# Patient Record
Sex: Female | Born: 1960 | Race: White | Hispanic: No | Marital: Married | Smoking: Former smoker
Health system: Southern US, Community
[De-identification: ages and names within clinical notes are randomized; demographics above are authoritative.]

## PROBLEM LIST (undated history)

## (undated) DIAGNOSIS — R112 Nausea with vomiting, unspecified: Secondary | ICD-10-CM

## (undated) DIAGNOSIS — I10 Essential (primary) hypertension: Secondary | ICD-10-CM

## (undated) DIAGNOSIS — Z9889 Other specified postprocedural states: Secondary | ICD-10-CM

## (undated) DIAGNOSIS — J189 Pneumonia, unspecified organism: Secondary | ICD-10-CM

## (undated) DIAGNOSIS — Z8489 Family history of other specified conditions: Secondary | ICD-10-CM

## (undated) HISTORY — PX: TUBAL LIGATION: SHX77

## (undated) HISTORY — PX: TOOTH EXTRACTION: SUR596

## (undated) HISTORY — PX: BREAST LUMPECTOMY: SHX2

---

## 2019-11-22 ENCOUNTER — Other Ambulatory Visit: Payer: Self-pay | Admitting: Orthopedic Surgery

## 2020-01-15 ENCOUNTER — Other Ambulatory Visit (HOSPITAL_COMMUNITY): Payer: Self-pay

## 2020-01-15 NOTE — Progress Notes (Signed)
[id number] [place], [place] - [address] AT NEXT TO [facility] [address] [place] [address] Phone: [phone] Fax: [phone]      Your procedure is scheduled on Thursday, January 18, 2020.  Report to [facility] Main Entrance "A" at 5:30 A.M., and check in at the Admitting office.  Call this number if you have problems the morning of surgery:  [phone]  Call [phone] if you have any questions prior to your surgery date Monday-Friday 8am-4pm    Remember:  Do not eat after midnight the night before your surgery  You may drink clear liquids until 4:30AM the morning of your surgery.    Clear liquids allowed are: Water, Non-Citrus Juices (without pulp), Carbonated Beverages, Clear Tea, Black Coffee Only, and Gatorade  Please complete your PRE-SURGERY ENSURE that was provided to you by 4:30AM the morning of surgery.  Please, if able, drink it in one setting. DO NOT SIP.    Take these medicines the morning of surgery with A SIP OF WATER :  Only if needed: Acetaminophen (Tylenol) Cyclobenzaprine (Flexeril) Methocarbamol (Robaxin)  7 days prior to surgery STOP taking any Aspirin (unless otherwise instructed by your surgeon), Aleve, Naproxen, Ibuprofen, Motrin, Advil, Goody's, BC's, all herbal medications, fish oil, and all vitamins.    The Morning of Surgery  Do not wear jewelry, make-up or nail polish.  Do not wear lotions, powders, or perfumes/colognes, or deodorant  Do not shave 48 hours prior to surgery.   Do not bring valuables to the hospital.  [facility] is not responsible for any belongings or valuables.  If you are a smoker, DO NOT Smoke 24 hours prior to surgery  If you wear a CPAP at night please bring your mask the morning of surgery   Remember that you must have someone to transport you home after your surgery, and remain with you for 24 hours if you are discharged the same day.   Please bring  cases for contacts, glasses, hearing aids, dentures or bridgework because it cannot be worn into surgery.    Leave your suitcase in the car.  After surgery it may be brought to your room.  For patients admitted to the hospital, discharge time will be determined by your treatment team.  Patients discharged the day of surgery will not be allowed to drive home.    Special instructions:   [place]- Preparing For Surgery  Before surgery, you can play an important role. Because skin is not sterile, your skin needs to be as free of germs as possible. You can reduce the number of germs on your skin by washing with CHG (chlorahexidine gluconate) Soap before surgery.  CHG is an antiseptic cleaner which kills germs and bonds with the skin to continue killing germs even after washing.    Oral Hygiene is also important to reduce your risk of infection.  Remember - BRUSH YOUR TEETH THE MORNING OF SURGERY WITH YOUR REGULAR TOOTHPASTE  Please do not use if you have an allergy to CHG or antibacterial soaps. If your skin becomes reddened/irritated stop using the CHG.  Do not shave (including legs and underarms) for at least 48 hours prior to first CHG shower. It is OK to shave your face.  Please follow these instructions carefully.   1. Shower the NIGHT BEFORE SURGERY and the MORNING OF SURGERY with CHG Soap.   2. If you chose to wash your hair, wash your hair first  as usual with your normal shampoo.  3. After you shampoo, rinse your hair and body thoroughly to remove the shampoo.  4. Use CHG as you would any other liquid soap. You can apply CHG directly to the skin and wash gently with a scrungie or a clean washcloth.   5. Apply the CHG Soap to your body ONLY FROM THE NECK DOWN.  Do not use on open wounds or open sores. Avoid contact with your eyes, ears, mouth and genitals (private parts). Wash Face and genitals (private parts)  with your normal soap.   6. Wash thoroughly, paying special attention  to the area where your surgery will be performed.  7. Thoroughly rinse your body with warm water from the neck down.  8. DO NOT shower/wash with your normal soap after using and rinsing off the CHG Soap.  9. Pat yourself dry with a CLEAN TOWEL.  10. Wear CLEAN PAJAMAS to bed the night before surgery, wear comfortable clothes the morning of surgery  11. Place CLEAN SHEETS on your bed the night of your first shower and DO NOT SLEEP WITH PETS.    Day of Surgery:  Please shower the morning of surgery with the CHG soap Do not apply any deodorants/lotions. Please wear clean clothes to the hospital/surgery center.   Remember to brush your teeth WITH YOUR REGULAR TOOTHPASTE.   Please read over the following fact sheets that you were given.

## 2020-01-16 ENCOUNTER — Other Ambulatory Visit (HOSPITAL_COMMUNITY)
Admission: RE | Admit: 2020-01-16 | Discharge: 2020-01-16 | Disposition: A | Discharge status: Home / Self Care | Payer: Worker's Compensation | Source: Ambulatory Visit | Attending: Orthopedic Surgery | Admitting: Orthopedic Surgery

## 2020-01-16 ENCOUNTER — Encounter (HOSPITAL_COMMUNITY)
Admission: RE | Admit: 2020-01-16 | Discharge: 2020-01-16 | Disposition: A | Discharge status: Home / Self Care | Payer: Worker's Compensation | Source: Ambulatory Visit | Attending: Orthopedic Surgery | Admitting: Orthopedic Surgery

## 2020-01-16 ENCOUNTER — Other Ambulatory Visit: Payer: Self-pay

## 2020-01-16 ENCOUNTER — Encounter (HOSPITAL_COMMUNITY): Payer: Self-pay

## 2020-01-16 DIAGNOSIS — Z20822 Contact with and (suspected) exposure to covid-19: Secondary | ICD-10-CM | POA: Insufficient documentation

## 2020-01-16 DIAGNOSIS — I10 Essential (primary) hypertension: Secondary | ICD-10-CM | POA: Insufficient documentation

## 2020-01-16 DIAGNOSIS — Z01818 Encounter for other preprocedural examination: Secondary | ICD-10-CM | POA: Insufficient documentation

## 2020-01-16 HISTORY — DX: Pneumonia, unspecified organism: J18.9

## 2020-01-16 HISTORY — DX: Other specified postprocedural states: R11.2

## 2020-01-16 HISTORY — DX: Other specified postprocedural states: Z98.890

## 2020-01-16 HISTORY — DX: Essential (primary) hypertension: I10

## 2020-01-16 HISTORY — DX: Family history of other specified conditions: Z84.89

## 2020-01-16 LAB — CBC WITH DIFFERENTIAL/PLATELET
Abs Immature Granulocytes: 0.01 10*3/uL (ref 0.00–0.07)
Basophils Absolute: 0.1 10*3/uL (ref 0.0–0.1)
Basophils Relative: 1 %
Eosinophils Absolute: 0.2 10*3/uL (ref 0.0–0.5)
Eosinophils Relative: 2 %
HCT: 39 % (ref 36.0–46.0)
Hemoglobin: 12.7 g/dL (ref 12.0–15.0)
Immature Granulocytes: 0 %
Lymphocytes Relative: 24 %
Lymphs Abs: 1.6 10*3/uL (ref 0.7–4.0)
MCH: 33.4 pg (ref 26.0–34.0)
MCHC: 32.6 g/dL (ref 30.0–36.0)
MCV: 102.6 fL — ABNORMAL HIGH (ref 80.0–100.0)
Monocytes Absolute: 0.7 10*3/uL (ref 0.1–1.0)
Monocytes Relative: 10 %
Neutro Abs: 4.1 10*3/uL (ref 1.7–7.7)
Neutrophils Relative %: 63 %
Platelets: 308 10*3/uL (ref 150–400)
RBC: 3.8 MIL/uL — ABNORMAL LOW (ref 3.87–5.11)
RDW: 13.7 % (ref 11.5–15.5)
WBC: 6.6 10*3/uL (ref 4.0–10.5)
nRBC: 0 % (ref 0.0–0.2)

## 2020-01-16 LAB — TYPE AND SCREEN
ABO/RH(D): O POS
Antibody Screen: NEGATIVE

## 2020-01-16 LAB — URINALYSIS, ROUTINE W REFLEX MICROSCOPIC
Bilirubin Urine: NEGATIVE
Glucose, UA: NEGATIVE mg/dL
Hgb urine dipstick: NEGATIVE
Ketones, ur: NEGATIVE mg/dL
Leukocytes,Ua: NEGATIVE
Nitrite: NEGATIVE
Protein, ur: NEGATIVE mg/dL
Specific Gravity, Urine: 1.005 (ref 1.005–1.030)
pH: 6 (ref 5.0–8.0)

## 2020-01-16 LAB — COMPREHENSIVE METABOLIC PANEL
ALT: 11 U/L (ref 0–44)
AST: 22 U/L (ref 15–41)
Albumin: 4 g/dL (ref 3.5–5.0)
Alkaline Phosphatase: 90 U/L (ref 38–126)
Anion gap: 6 (ref 5–15)
BUN: 12 mg/dL (ref 6–20)
CO2: 28 mmol/L (ref 22–32)
Calcium: 9 mg/dL (ref 8.9–10.3)
Chloride: 105 mmol/L (ref 98–111)
Creatinine, Ser: 0.62 mg/dL (ref 0.44–1.00)
GFR calc Af Amer: >60 mL/min (ref >60)
GFR calc non Af Amer: >60 mL/min (ref >60)
Glucose, Bld: 94 mg/dL (ref 70–99)
Potassium: 3.9 mmol/L (ref 3.5–5.1)
Sodium: 139 mmol/L (ref 135–145)
Total Bilirubin: 0.7 mg/dL (ref 0.3–1.2)
Total Protein: 6.4 g/dL — ABNORMAL LOW (ref 6.5–8.1)

## 2020-01-16 LAB — SURGICAL PCR SCREEN
MRSA, PCR: NEGATIVE
Staphylococcus aureus: NEGATIVE

## 2020-01-16 LAB — APTT: aPTT: 34 seconds (ref 24–36)

## 2020-01-16 LAB — PROTIME-INR
INR: 1 (ref 0.8–1.2)
Prothrombin Time: 13.2 seconds (ref 11.4–15.2)

## 2020-01-16 LAB — ABO/RH: ABO/RH(D): O POS

## 2020-01-16 LAB — SARS CORONAVIRUS 2 (TAT 6-24 HRS): SARS Coronavirus 2: NEGATIVE

## 2020-01-16 NOTE — Progress Notes (Signed)
PCP - [facility] Department     EKG - 01/16/20    ERAS Protcol - yes PRE-SURGERY Ensure or G2- ensure given  COVID TEST-  01/16/20   Anesthesia review: no  Patient denies shortness of breath, fever, cough and chest pain at PAT appointment   All instructions explained to the patient, with a verbal understanding of the material. Patient agrees to go over the instructions while at home for a better understanding. Patient also instructed to self quarantine after being tested for COVID-19. The opportunity to ask questions was provided.

## 2020-01-17 NOTE — Anesthesia Preprocedure Evaluation (Addendum)
Anesthesia Evaluation  Patient identified by MRN, date of birth, ID band Patient awake    Reviewed: Allergy & Precautions, NPO status , Patient's Chart, lab work & pertinent test results  History of Anesthesia Complications (+) PONV  Airway Mallampati: II  TM Distance: >3 FB Neck ROM: Full    Dental no notable dental hx. (+) Teeth Intact, Dental Advisory Given,    Pulmonary neg pulmonary ROS, former smoker,    Pulmonary exam normal breath sounds clear to auscultation       Cardiovascular Exercise Tolerance: Good hypertension, Pt. on medications Normal cardiovascular exam Rhythm:Regular Rate:Normal     Neuro/Psych negative neurological ROS  negative psych ROS   GI/Hepatic negative GI ROS, Neg liver ROS,   Endo/Other  negative endocrine ROS  Renal/GU K+ 3.9 Cr 0.62     Musculoskeletal negative musculoskeletal ROS (+)   Abdominal (+) - obese,   Peds  Hematology hgb 12.7 Plt 308   Anesthesia Other Findings   Reproductive/Obstetrics                            Anesthesia Physical Anesthesia Plan  ASA: II  Anesthesia Plan: General   Post-op Pain Management:    Induction: Intravenous  PONV Risk Score and Plan: 4 or greater and Treatment may vary due to age or medical condition, Ondansetron, Dexamethasone and Midazolam  Airway Management Planned: Oral ETT  Additional Equipment: None  Intra-op Plan:   Post-operative Plan: Extubation in OR  Informed Consent: I have reviewed the patients History and Physical, chart, labs and discussed the procedure including the risks, benefits and alternatives for the proposed anesthesia with the patient or authorized representative who has indicated his/her understanding and acceptance.     Dental advisory given  Plan Discussed with: CRNA  Anesthesia Plan Comments:        Anesthesia Quick Evaluation

## 2020-01-18 ENCOUNTER — Encounter (HOSPITAL_COMMUNITY): Payer: Self-pay | Admitting: Orthopedic Surgery

## 2020-01-18 ENCOUNTER — Inpatient Hospital Stay (HOSPITAL_COMMUNITY)
Admission: RE | Admit: 2020-01-18 | Discharge: 2020-01-19 | DRG: 455 | Disposition: A | Discharge status: Home / Self Care | Payer: Worker's Compensation | Attending: Orthopedic Surgery | Admitting: Orthopedic Surgery

## 2020-01-18 ENCOUNTER — Encounter (HOSPITAL_COMMUNITY)
Admission: RE | Disposition: A | Discharge status: Home / Self Care | Payer: Self-pay | Source: Home / Self Care | Attending: Orthopedic Surgery

## 2020-01-18 ENCOUNTER — Inpatient Hospital Stay (HOSPITAL_COMMUNITY): Payer: Worker's Compensation | Admitting: Anesthesiology

## 2020-01-18 ENCOUNTER — Inpatient Hospital Stay (HOSPITAL_COMMUNITY): Payer: Worker's Compensation

## 2020-01-18 ENCOUNTER — Other Ambulatory Visit: Payer: Self-pay

## 2020-01-18 DIAGNOSIS — Z8701 Personal history of pneumonia (recurrent): Secondary | ICD-10-CM | POA: Diagnosis not present

## 2020-01-18 DIAGNOSIS — I10 Essential (primary) hypertension: Secondary | ICD-10-CM | POA: Diagnosis present

## 2020-01-18 DIAGNOSIS — Z6827 Body mass index (BMI) 27.0-27.9, adult: Secondary | ICD-10-CM

## 2020-01-18 DIAGNOSIS — M4800 Spinal stenosis, site unspecified: Secondary | ICD-10-CM | POA: Diagnosis present

## 2020-01-18 DIAGNOSIS — M4316 Spondylolisthesis, lumbar region: Secondary | ICD-10-CM | POA: Diagnosis present

## 2020-01-18 DIAGNOSIS — Z87891 Personal history of nicotine dependence: Secondary | ICD-10-CM

## 2020-01-18 DIAGNOSIS — E669 Obesity, unspecified: Secondary | ICD-10-CM | POA: Diagnosis present

## 2020-01-18 DIAGNOSIS — Z20822 Contact with and (suspected) exposure to covid-19: Secondary | ICD-10-CM | POA: Diagnosis present

## 2020-01-18 DIAGNOSIS — M5410 Radiculopathy, site unspecified: Secondary | ICD-10-CM

## 2020-01-18 DIAGNOSIS — M5416 Radiculopathy, lumbar region: Secondary | ICD-10-CM | POA: Diagnosis present

## 2020-01-18 DIAGNOSIS — Z79899 Other long term (current) drug therapy: Secondary | ICD-10-CM

## 2020-01-18 DIAGNOSIS — Z880 Allergy status to penicillin: Secondary | ICD-10-CM | POA: Diagnosis not present

## 2020-01-18 DIAGNOSIS — Z419 Encounter for procedure for purposes other than remedying health state, unspecified: Secondary | ICD-10-CM

## 2020-01-18 DIAGNOSIS — Z9109 Other allergy status, other than to drugs and biological substances: Secondary | ICD-10-CM

## 2020-01-18 DIAGNOSIS — M48062 Spinal stenosis, lumbar region with neurogenic claudication: Secondary | ICD-10-CM | POA: Diagnosis present

## 2020-01-18 HISTORY — PX: TRANSFORAMINAL LUMBAR INTERBODY FUSION (TLIF) WITH PEDICLE SCREW FIXATION 2 LEVEL: SHX6142

## 2020-01-18 SURGERY — TRANSFORAMINAL LUMBAR INTERBODY FUSION (TLIF) WITH PEDICLE SCREW FIXATION 2 LEVEL
Anesthesia: General | Site: Back | Laterality: Left

## 2020-01-18 MED ORDER — ZOLPIDEM TARTRATE 5 MG PO TABS: 5.0000 mg | ORAL_TABLET | Freq: Every evening | ORAL | Status: DC | PRN

## 2020-01-18 MED ORDER — LIDOCAINE 2% (20 MG/ML) 5 ML SYRINGE
INTRAMUSCULAR | Status: AC
  Filled 2020-01-18: qty 5

## 2020-01-18 MED ORDER — ACETAMINOPHEN 10 MG/ML IV SOLN: 1000.0000 mg | Freq: Once | INTRAVENOUS | Status: DC | PRN

## 2020-01-18 MED ORDER — MIDAZOLAM HCL 5 MG/5ML IJ SOLN
INTRAMUSCULAR | Status: DC | PRN
  Administered 2020-01-18: 2 mg via INTRAVENOUS

## 2020-01-18 MED ORDER — PROPOFOL 10 MG/ML IV BOLUS
INTRAVENOUS | Status: AC
  Filled 2020-01-18: qty 20

## 2020-01-18 MED ORDER — 0.9 % SODIUM CHLORIDE (POUR BTL) OPTIME
TOPICAL | Status: DC | PRN
  Administered 2020-01-18: 09:00:00 3000 mL

## 2020-01-18 MED ORDER — SCOPOLAMINE 1 MG/3DAYS TD PT72
1.0000 | MEDICATED_PATCH | Freq: Once | TRANSDERMAL | Status: DC
  Administered 2020-01-18: 1.5 mg via TRANSDERMAL
  Filled 2020-01-18: qty 1

## 2020-01-18 MED ORDER — PHENYLEPHRINE 40 MCG/ML (10ML) SYRINGE FOR IV PUSH (FOR BLOOD PRESSURE SUPPORT)
PREFILLED_SYRINGE | INTRAVENOUS | Status: AC
  Filled 2020-01-18: qty 20

## 2020-01-18 MED ORDER — DEXAMETHASONE SODIUM PHOSPHATE 10 MG/ML IJ SOLN
INTRAMUSCULAR | Status: DC | PRN
  Administered 2020-01-18: 10 mg via INTRAVENOUS

## 2020-01-18 MED ORDER — PHENYLEPHRINE 40 MCG/ML (10ML) SYRINGE FOR IV PUSH (FOR BLOOD PRESSURE SUPPORT)
PREFILLED_SYRINGE | INTRAVENOUS | Status: DC | PRN
  Administered 2020-01-18: 80 ug via INTRAVENOUS

## 2020-01-18 MED ORDER — THROMBIN 20000 UNITS EX KIT
PACK | CUTANEOUS | Status: DC | PRN
  Administered 2020-01-18: 20 mL via TOPICAL

## 2020-01-18 MED ORDER — FENTANYL CITRATE (PF) 250 MCG/5ML IJ SOLN
INTRAMUSCULAR | Status: DC | PRN
  Administered 2020-01-18: 150 ug via INTRAVENOUS
  Administered 2020-01-18 (×2): 100 ug via INTRAVENOUS
  Administered 2020-01-18 (×3): 50 ug via INTRAVENOUS
  Administered 2020-01-18: 100 ug via INTRAVENOUS
  Administered 2020-01-18: 50 ug via INTRAVENOUS

## 2020-01-18 MED ORDER — FLEET ENEMA 7-19 GM/118ML RE ENEM: 1.0000 | ENEMA | Freq: Once | RECTAL | Status: DC | PRN

## 2020-01-18 MED ORDER — MENTHOL 3 MG MT LOZG: 1.0000 | LOZENGE | OROMUCOSAL | Status: DC | PRN

## 2020-01-18 MED ORDER — BISACODYL 5 MG PO TBEC: 5.0000 mg | DELAYED_RELEASE_TABLET | Freq: Every day | ORAL | Status: DC | PRN

## 2020-01-18 MED ORDER — HYDROCODONE-ACETAMINOPHEN 7.5-325 MG PO TABS: 1.0000 | ORAL_TABLET | Freq: Once | ORAL | Status: DC | PRN

## 2020-01-18 MED ORDER — METHYLENE BLUE 0.5 % INJ SOLN
INTRAVENOUS | Status: DC | PRN
  Administered 2020-01-18: .2 mL via INTRADERMAL

## 2020-01-18 MED ORDER — SUCCINYLCHOLINE CHLORIDE 200 MG/10ML IV SOSY
PREFILLED_SYRINGE | INTRAVENOUS | Status: DC | PRN
  Administered 2020-01-18: 100 mg via INTRAVENOUS

## 2020-01-18 MED ORDER — ROCURONIUM BROMIDE 10 MG/ML (PF) SYRINGE
PREFILLED_SYRINGE | INTRAVENOUS | Status: AC
  Filled 2020-01-18: qty 10

## 2020-01-18 MED ORDER — ALUM & MAG HYDROXIDE-SIMETH 200-200-20 MG/5ML PO SUSP: 30.0000 mL | Freq: Four times a day (QID) | ORAL | Status: DC | PRN

## 2020-01-18 MED ORDER — BUPIVACAINE LIPOSOME 1.3 % IJ SUSP
20.0000 mL | Freq: Once | INTRAMUSCULAR | Status: DC
  Filled 2020-01-18: qty 20

## 2020-01-18 MED ORDER — THROMBIN (RECOMBINANT) 20000 UNITS EX SOLR
CUTANEOUS | Status: AC
  Filled 2020-01-18: qty 20000

## 2020-01-18 MED ORDER — MEPERIDINE HCL 25 MG/ML IJ SOLN: 6.2500 mg | INTRAMUSCULAR | Status: DC | PRN

## 2020-01-18 MED ORDER — PROPOFOL 10 MG/ML IV BOLUS
INTRAVENOUS | Status: DC | PRN
  Administered 2020-01-18: 150 mg via INTRAVENOUS

## 2020-01-18 MED ORDER — CYCLOBENZAPRINE HCL 10 MG PO TABS
10.0000 mg | ORAL_TABLET | Freq: Three times a day (TID) | ORAL | Status: DC | PRN
  Administered 2020-01-18 – 2020-01-19 (×3): 10 mg via ORAL
  Filled 2020-01-18 (×3): qty 1

## 2020-01-18 MED ORDER — SENNOSIDES-DOCUSATE SODIUM 8.6-50 MG PO TABS: 1.0000 | ORAL_TABLET | Freq: Every evening | ORAL | Status: DC | PRN

## 2020-01-18 MED ORDER — POTASSIUM CHLORIDE IN NACL 20-0.9 MEQ/L-% IV SOLN: INTRAVENOUS | Status: DC

## 2020-01-18 MED ORDER — SODIUM CHLORIDE 0.9 % IV SOLN: INTRAVENOUS | Status: DC | PRN

## 2020-01-18 MED ORDER — LIDOCAINE 2% (20 MG/ML) 5 ML SYRINGE
INTRAMUSCULAR | Status: DC | PRN
  Administered 2020-01-18: 100 mg via INTRAVENOUS

## 2020-01-18 MED ORDER — SODIUM CHLORIDE 0.9 % IV SOLN: 250.0000 mL | INTRAVENOUS | Status: DC

## 2020-01-18 MED ORDER — OXYCODONE-ACETAMINOPHEN 5-325 MG PO TABS
1.0000 | ORAL_TABLET | ORAL | Status: DC | PRN
  Administered 2020-01-18 – 2020-01-19 (×6): 2 via ORAL
  Filled 2020-01-18 (×6): qty 2

## 2020-01-18 MED ORDER — LOSARTAN POTASSIUM 50 MG PO TABS
50.0000 mg | ORAL_TABLET | Freq: Two times a day (BID) | ORAL | Status: DC
  Administered 2020-01-18 – 2020-01-19 (×2): 50 mg via ORAL
  Filled 2020-01-18 (×2): qty 1

## 2020-01-18 MED ORDER — MIDAZOLAM HCL 2 MG/2ML IJ SOLN
INTRAMUSCULAR | Status: AC
  Filled 2020-01-18: qty 2

## 2020-01-18 MED ORDER — LABETALOL HCL 5 MG/ML IV SOLN
INTRAVENOUS | Status: AC
  Filled 2020-01-18: qty 4

## 2020-01-18 MED ORDER — ALBUMIN HUMAN 5 % IV SOLN: INTRAVENOUS | Status: DC | PRN

## 2020-01-18 MED ORDER — LABETALOL HCL 5 MG/ML IV SOLN
5.0000 mg | Freq: Once | INTRAVENOUS | Status: AC
  Administered 2020-01-18: 5 mg via INTRAVENOUS

## 2020-01-18 MED ORDER — METHYLENE BLUE 0.5 % INJ SOLN
INTRAVENOUS | Status: AC
  Filled 2020-01-18: qty 10

## 2020-01-18 MED ORDER — LACTATED RINGERS IV SOLN: INTRAVENOUS | Status: DC | PRN

## 2020-01-18 MED ORDER — BUPIVACAINE-EPINEPHRINE 0.25% -1:200000 IJ SOLN
INTRAMUSCULAR | Status: DC | PRN
  Administered 2020-01-18: 30 mL

## 2020-01-18 MED ORDER — EPINEPHRINE PF 1 MG/ML IJ SOLN
INTRAMUSCULAR | Status: AC
  Filled 2020-01-18: qty 1

## 2020-01-18 MED ORDER — ROCURONIUM BROMIDE 10 MG/ML (PF) SYRINGE
PREFILLED_SYRINGE | INTRAVENOUS | Status: DC | PRN
  Administered 2020-01-18: 20 mg via INTRAVENOUS
  Administered 2020-01-18: 40 mg via INTRAVENOUS
  Administered 2020-01-18: 10 mg via INTRAVENOUS
  Administered 2020-01-18 (×2): 20 mg via INTRAVENOUS

## 2020-01-18 MED ORDER — AMLODIPINE BESYLATE 5 MG PO TABS
5.0000 mg | ORAL_TABLET | Freq: Every day | ORAL | Status: DC
  Administered 2020-01-18: 5 mg via ORAL
  Filled 2020-01-18: qty 1

## 2020-01-18 MED ORDER — VANCOMYCIN HCL IN DEXTROSE 1-5 GM/200ML-% IV SOLN
1000.0000 mg | Freq: Two times a day (BID) | INTRAVENOUS | Status: DC
  Administered 2020-01-18 – 2020-01-19 (×2): 1000 mg via INTRAVENOUS
  Filled 2020-01-18 (×2): qty 200

## 2020-01-18 MED ORDER — POVIDONE-IODINE 7.5 % EX SOLN
Freq: Once | CUTANEOUS | Status: DC
  Filled 2020-01-18: qty 118

## 2020-01-18 MED ORDER — ONDANSETRON HCL 4 MG PO TABS: 4.0000 mg | ORAL_TABLET | Freq: Four times a day (QID) | ORAL | Status: DC | PRN

## 2020-01-18 MED ORDER — VANCOMYCIN HCL IN DEXTROSE 1-5 GM/200ML-% IV SOLN
1000.0000 mg | Freq: Once | INTRAVENOUS | Status: AC
  Administered 2020-01-18: 1000 mg via INTRAVENOUS

## 2020-01-18 MED ORDER — PHENOL 1.4 % MT LIQD: 1.0000 | OROMUCOSAL | Status: DC | PRN

## 2020-01-18 MED ORDER — MORPHINE SULFATE (PF) 2 MG/ML IV SOLN: 1.0000 mg | INTRAVENOUS | Status: DC | PRN

## 2020-01-18 MED ORDER — ACETAMINOPHEN 650 MG RE SUPP: 650.0000 mg | RECTAL | Status: DC | PRN

## 2020-01-18 MED ORDER — PHENYLEPHRINE HCL-NACL 10-0.9 MG/250ML-% IV SOLN
INTRAVENOUS | Status: DC | PRN
  Administered 2020-01-18: 30 ug/min via INTRAVENOUS

## 2020-01-18 MED ORDER — ACETAMINOPHEN 325 MG PO TABS: 650.0000 mg | ORAL_TABLET | ORAL | Status: DC | PRN

## 2020-01-18 MED ORDER — KETAMINE HCL 10 MG/ML IJ SOLN
INTRAMUSCULAR | Status: DC | PRN
  Administered 2020-01-18: 50 mg via INTRAVENOUS

## 2020-01-18 MED ORDER — ONDANSETRON HCL 4 MG/2ML IJ SOLN
INTRAMUSCULAR | Status: DC | PRN
  Administered 2020-01-18: 4 mg via INTRAVENOUS

## 2020-01-18 MED ORDER — ARTIFICIAL TEARS OPHTHALMIC OINT
TOPICAL_OINTMENT | OPHTHALMIC | Status: DC | PRN
  Administered 2020-01-18: 1 via OPHTHALMIC

## 2020-01-18 MED ORDER — FENTANYL CITRATE (PF) 250 MCG/5ML IJ SOLN
INTRAMUSCULAR | Status: AC
  Filled 2020-01-18: qty 5

## 2020-01-18 MED ORDER — PROPOFOL 500 MG/50ML IV EMUL
INTRAVENOUS | Status: DC | PRN
  Administered 2020-01-18: 150 ug/kg/min via INTRAVENOUS
  Administered 2020-01-18: 120 ug/kg/min via INTRAVENOUS
  Administered 2020-01-18: 150 ug/kg/min via INTRAVENOUS
  Administered 2020-01-18: 120 ug/kg/min via INTRAVENOUS

## 2020-01-18 MED ORDER — KETAMINE HCL 50 MG/5ML IJ SOSY
PREFILLED_SYRINGE | INTRAMUSCULAR | Status: AC
  Filled 2020-01-18: qty 5

## 2020-01-18 MED ORDER — HYDROMORPHONE HCL 1 MG/ML IJ SOLN
INTRAMUSCULAR | Status: AC
  Filled 2020-01-18: qty 1

## 2020-01-18 MED ORDER — PROMETHAZINE HCL 25 MG/ML IJ SOLN: 6.2500 mg | INTRAMUSCULAR | Status: DC | PRN

## 2020-01-18 MED ORDER — SODIUM CHLORIDE 0.9% FLUSH
3.0000 mL | Freq: Two times a day (BID) | INTRAVENOUS | Status: DC
  Administered 2020-01-18 – 2020-01-19 (×2): 3 mL via INTRAVENOUS

## 2020-01-18 MED ORDER — SUCCINYLCHOLINE CHLORIDE 200 MG/10ML IV SOSY
PREFILLED_SYRINGE | INTRAVENOUS | Status: AC
  Filled 2020-01-18: qty 10

## 2020-01-18 MED ORDER — OXYBUTYNIN CHLORIDE ER 5 MG PO TB24
5.0000 mg | ORAL_TABLET | Freq: Every day | ORAL | Status: DC
  Administered 2020-01-18: 22:00:00 5 mg via ORAL
  Filled 2020-01-18 (×2): qty 1

## 2020-01-18 MED ORDER — ONDANSETRON HCL 4 MG/2ML IJ SOLN: 4.0000 mg | Freq: Four times a day (QID) | INTRAMUSCULAR | Status: DC | PRN

## 2020-01-18 MED ORDER — PROPOFOL 500 MG/50ML IV EMUL: INTRAVENOUS | Status: DC | PRN

## 2020-01-18 MED ORDER — BUPIVACAINE HCL (PF) 0.25 % IJ SOLN
INTRAMUSCULAR | Status: AC
  Filled 2020-01-18: qty 30

## 2020-01-18 MED ORDER — VANCOMYCIN HCL IN DEXTROSE 1-5 GM/200ML-% IV SOLN
INTRAVENOUS | Status: AC
  Filled 2020-01-18: qty 200

## 2020-01-18 MED ORDER — DEXAMETHASONE SODIUM PHOSPHATE 10 MG/ML IJ SOLN
INTRAMUSCULAR | Status: AC
  Filled 2020-01-18: qty 1

## 2020-01-18 MED ORDER — ARTIFICIAL TEARS OPHTHALMIC OINT
TOPICAL_OINTMENT | OPHTHALMIC | Status: AC
  Filled 2020-01-18: qty 3.5

## 2020-01-18 MED ORDER — SUGAMMADEX SODIUM 200 MG/2ML IV SOLN
INTRAVENOUS | Status: DC | PRN
  Administered 2020-01-18: 200 mg via INTRAVENOUS

## 2020-01-18 MED ORDER — DOCUSATE SODIUM 100 MG PO CAPS
100.0000 mg | ORAL_CAPSULE | Freq: Two times a day (BID) | ORAL | Status: DC
  Administered 2020-01-18 – 2020-01-19 (×3): 100 mg via ORAL
  Filled 2020-01-18 (×3): qty 1

## 2020-01-18 MED ORDER — PROPOFOL 1000 MG/100ML IV EMUL
INTRAVENOUS | Status: AC
  Filled 2020-01-18: qty 300

## 2020-01-18 MED ORDER — BUPIVACAINE LIPOSOME 1.3 % IJ SUSP
INTRAMUSCULAR | Status: DC | PRN
  Administered 2020-01-18: 20 mL

## 2020-01-18 MED ORDER — SODIUM CHLORIDE 0.9% FLUSH: 3.0000 mL | INTRAVENOUS | Status: DC | PRN

## 2020-01-18 MED ORDER — HYDROMORPHONE HCL 1 MG/ML IJ SOLN
0.2500 mg | INTRAMUSCULAR | Status: DC | PRN
  Administered 2020-01-18 (×3): 0.5 mg via INTRAVENOUS

## 2020-01-18 MED ORDER — PROPOFOL 1000 MG/100ML IV EMUL
INTRAVENOUS | Status: AC
  Filled 2020-01-18: qty 100

## 2020-01-18 SURGICAL SUPPLY — 96 items
BENZOIN TINCTURE PRP APPL 2/3 (GAUZE/BANDAGES/DRESSINGS) ×3 IMPLANT
BLADE CLIPPER SURG (BLADE) IMPLANT
BONE VIVIGEN FORMABLE 10CC (Bone Implant) ×3 IMPLANT
BUR PRESCISION 1.7 ELITE (BURR) ×3 IMPLANT
BUR ROUND FLUTED 5 RND (BURR) ×2 IMPLANT
BUR ROUND FLUTED 5MM RND (BURR) ×1
BUR ROUND PRECISION 4.0 (BURR) IMPLANT
BUR ROUND PRECISION 4.0MM (BURR)
BUR SABER RD CUTTING 3.0 (BURR) IMPLANT
BUR SABER RD CUTTING 3.0MM (BURR)
CAGE BULLET CONCORDE 9X10X27 (Cage) ×2 IMPLANT
CAGE BULLET CONCORDE 9X10X27MM (Cage) ×1 IMPLANT
CAGE CONCORDE BULLET 11X12X27 (Cage) ×2 IMPLANT
CAGE SPNL PRLL BLT NOSE 27X11 (Cage) ×1 IMPLANT
CARTRIDGE OIL MAESTRO DRILL (MISCELLANEOUS) ×1 IMPLANT
CLOSURE STERI-STRIP 1/2X4 (GAUZE/BANDAGES/DRESSINGS) ×1
CLOSURE WOUND 1/2 X4 (GAUZE/BANDAGES/DRESSINGS) ×2
CLSR STERI-STRIP ANTIMIC 1/2X4 (GAUZE/BANDAGES/DRESSINGS) ×2 IMPLANT
CONT SPEC 4OZ CLIKSEAL STRL BL (MISCELLANEOUS) ×3 IMPLANT
COVER MAYO STAND STRL (DRAPES) ×6 IMPLANT
COVER SURGICAL LIGHT HANDLE (MISCELLANEOUS) ×3 IMPLANT
COVER WAND RF STERILE (DRAPES) ×3 IMPLANT
DIFFUSER DRILL AIR PNEUMATIC (MISCELLANEOUS) ×3 IMPLANT
DRAIN CHANNEL 15F RND FF W/TCR (WOUND CARE) IMPLANT
DRAPE C-ARM 42X72 X-RAY (DRAPES) ×3 IMPLANT
DRAPE C-ARMOR (DRAPES) IMPLANT
DRAPE POUCH INSTRU U-SHP 10X18 (DRAPES) ×3 IMPLANT
DRAPE SURG 17X23 STRL (DRAPES) ×12 IMPLANT
DURAPREP 26ML APPLICATOR (WOUND CARE) ×3 IMPLANT
ELECT BLADE 4.0 EZ CLEAN MEGAD (MISCELLANEOUS) ×3
ELECT CAUTERY BLADE 6.4 (BLADE) ×3 IMPLANT
ELECT REM PT RETURN 9FT ADLT (ELECTROSURGICAL) ×3
ELECTRODE BLDE 4.0 EZ CLN MEGD (MISCELLANEOUS) ×1 IMPLANT
ELECTRODE REM PT RTRN 9FT ADLT (ELECTROSURGICAL) ×1 IMPLANT
EVACUATOR SILICONE 100CC (DRAIN) IMPLANT
FEE INTRAOP MONITOR IMPULS NCS (MISCELLANEOUS) ×1 IMPLANT
FILTER STRAW FLUID ASPIR (MISCELLANEOUS) ×3 IMPLANT
GAUZE 4X4 16PLY RFD (DISPOSABLE) ×3 IMPLANT
GAUZE SPONGE 4X4 12PLY STRL (GAUZE/BANDAGES/DRESSINGS) ×3 IMPLANT
GLOVE BIO SURGEON STRL SZ7 (GLOVE) ×3 IMPLANT
GLOVE BIO SURGEON STRL SZ8 (GLOVE) ×3 IMPLANT
GLOVE BIOGEL PI IND STRL 7.0 (GLOVE) ×2 IMPLANT
GLOVE BIOGEL PI IND STRL 8 (GLOVE) ×1 IMPLANT
GLOVE BIOGEL PI INDICATOR 7.0 (GLOVE) ×4
GLOVE BIOGEL PI INDICATOR 8 (GLOVE) ×2
GLOVE SURG SS PI 7.0 STRL IVOR (GLOVE) ×3 IMPLANT
GOWN STRL REUS W/ TWL LRG LVL3 (GOWN DISPOSABLE) ×2 IMPLANT
GOWN STRL REUS W/ TWL XL LVL3 (GOWN DISPOSABLE) ×1 IMPLANT
GOWN STRL REUS W/TWL LRG LVL3 (GOWN DISPOSABLE) ×4
GOWN STRL REUS W/TWL XL LVL3 (GOWN DISPOSABLE) ×2
INTRAOP MONITOR FEE IMPULS NCS (MISCELLANEOUS) ×1
INTRAOP MONITOR FEE IMPULSE (MISCELLANEOUS) ×2
IV CATH 14GX2 1/4 (CATHETERS) ×3 IMPLANT
KIT BASIN OR (CUSTOM PROCEDURE TRAY) ×3 IMPLANT
KIT POSITION SURG JACKSON T1 (MISCELLANEOUS) ×3 IMPLANT
KIT TURNOVER KIT B (KITS) ×3 IMPLANT
MARKER SKIN DUAL TIP RULER LAB (MISCELLANEOUS) ×6 IMPLANT
NEEDLE 18GX1X1/2 (RX/OR ONLY) (NEEDLE) ×3 IMPLANT
NEEDLE 22X1 1/2 (OR ONLY) (NEEDLE) ×6 IMPLANT
NEEDLE HYPO 25GX1X1/2 BEV (NEEDLE) ×3 IMPLANT
NEEDLE SPNL 18GX3.5 QUINCKE PK (NEEDLE) ×6 IMPLANT
NS IRRIG 1000ML POUR BTL (IV SOLUTION) ×3 IMPLANT
OIL CARTRIDGE MAESTRO DRILL (MISCELLANEOUS) ×3
PACK LAMINECTOMY ORTHO (CUSTOM PROCEDURE TRAY) ×3 IMPLANT
PACK UNIVERSAL I (CUSTOM PROCEDURE TRAY) ×3 IMPLANT
PAD ARMBOARD 7.5X6 YLW CONV (MISCELLANEOUS) ×6 IMPLANT
PATTIES SURGICAL .5 X1 (DISPOSABLE) ×3 IMPLANT
PATTIES SURGICAL .5X1.5 (GAUZE/BANDAGES/DRESSINGS) ×3 IMPLANT
PROBE PED 2.3 SCRW/BT NCS (MISCELLANEOUS) ×1 IMPLANT
PROBE PEDCLE 2.3 SCRW/BALL TIP (MISCELLANEOUS) ×2
ROD EXPEDIUM PER BENT 65MM (Rod) ×6 IMPLANT
SCREW SET SINGLE INNER (Screw) ×18 IMPLANT
SCREW VIPER CORT FIX 6.00X30 (Screw) ×12 IMPLANT
SCREW VIPER CORT FIX 6X35 (Screw) ×6 IMPLANT
SPONGE INTESTINAL PEANUT (DISPOSABLE) ×3 IMPLANT
SPONGE SURGIFOAM ABS GEL 100 (HEMOSTASIS) ×3 IMPLANT
STRIP CLOSURE SKIN 1/2X4 (GAUZE/BANDAGES/DRESSINGS) ×4 IMPLANT
SURGIFLO W/THROMBIN 8M KIT (HEMOSTASIS) IMPLANT
SUT BONE WAX W31G (SUTURE) ×3 IMPLANT
SUT MNCRL AB 4-0 PS2 18 (SUTURE) ×3 IMPLANT
SUT VIC AB 0 CT1 18XCR BRD 8 (SUTURE) ×1 IMPLANT
SUT VIC AB 0 CT1 8-18 (SUTURE) ×2
SUT VIC AB 1 CT1 18XCR BRD 8 (SUTURE) ×1 IMPLANT
SUT VIC AB 1 CT1 8-18 (SUTURE) ×2
SUT VIC AB 2-0 CT2 18 VCP726D (SUTURE) ×3 IMPLANT
SYR 20ML LL LF (SYRINGE) ×6 IMPLANT
SYR BULB IRRIGATION 50ML (SYRINGE) ×3 IMPLANT
SYR CONTROL 10ML LL (SYRINGE) ×6 IMPLANT
SYR TB 1ML LUER SLIP (SYRINGE) ×3 IMPLANT
TAP EXPEDIUM DL 4.35 (INSTRUMENTS) ×3 IMPLANT
TAP EXPEDIUM DL 5.0 (INSTRUMENTS) ×3 IMPLANT
TAP EXPEDIUM DL 6.0 (INSTRUMENTS) ×3 IMPLANT
TAPE CLOTH SURG 6X10 WHT LF (GAUZE/BANDAGES/DRESSINGS) ×3 IMPLANT
TRAY FOLEY MTR SLVR 16FR STAT (SET/KITS/TRAYS/PACK) ×3 IMPLANT
WATER STERILE IRR 1000ML POUR (IV SOLUTION) ×3 IMPLANT
YANKAUER SUCT BULB TIP NO VENT (SUCTIONS) ×3 IMPLANT

## 2020-01-18 NOTE — Progress Notes (Signed)
Spoke with Dr Richardson Landry re: SBP/DBP elevation with preop being 136/92, orders x 1 for labetalol IV

## 2020-01-18 NOTE — H&P (Signed)
PREOPERATIVE H&P  Chief Complaint: Bilateral leg pain  HPI: Tracy Vincent is a 59 y.o. female who presents with ongoing pain in the bilateral legs x 3 years  MRI reveals severe stenosis and instability spanning L2-L5  Patient has failed multiple forms of conservative care and continues to have pain (see office notes for additional details regarding the patient's full course of treatment)  Past Medical History:  Diagnosis Date  . Family history of adverse reaction to anesthesia   . Hypertension   . Pneumonia    hx of pneumonia per pt   . PONV (postoperative nausea and vomiting)    Past Surgical History:  Procedure Laterality Date  . BREAST LUMPECTOMY Left    per pt in 2001  . TOOTH EXTRACTION Left    per pt on 01/09/20 (pt was given lidocaine for procedure)  . TUBAL LIGATION     per pt 1998   Social History   Socioeconomic History  . Marital status: Married    Spouse name: Not on file  . Number of children: Not on file  . Years of education: Not on file  . Highest education level: Not on file  Occupational History  . Not on file  Tobacco Use  . Smoking status: Former Smoker    Packs/day: 0.00    Quit date: 1998    Years since quitting: 23.0  . Smokeless tobacco: Never Used  Substance and Sexual Activity  . Alcohol use: Yes    Alcohol/week: 3.0 standard drinks    Types: 3 Glasses of wine per week  . Drug use: Never  . Sexual activity: Not on file  Other Topics Concern  . Not on file  Social History Narrative  . Not on file   Social Determinants of Health   Financial Resource Strain:   . Difficulty of Paying Living Expenses: Not on file  Food Insecurity:   . Worried About Charity fundraiser in the Last Year: Not on file  . Ran Out of Food in the Last Year: Not on file  Transportation Needs:   . Lack of Transportation (Medical): Not on file  . Lack of Transportation (Non-Medical): Not on file  Physical Activity:   . Days of Exercise per Week: Not on  file  . Minutes of Exercise per Session: Not on file  Stress:   . Feeling of Stress : Not on file  Social Connections:   . Frequency of Communication with Friends and Family: Not on file  . Frequency of Social Gatherings with Friends and Family: Not on file  . Attends Religious Services: Not on file  . Active Member of Clubs or Organizations: Not on file  . Attends Archivist Meetings: Not on file  . Marital Status: Not on file   History reviewed. No pertinent family history. Allergies  Allergen Reactions  . Other     Other reaction(s): Flushing (ALLERGY/intolerance) Brain 360 Tablets--Caused "Hot/Flush Feeling all over body."  . Penicillins Hives and Swelling    SWELLING OF THE TONGUE-- PEN G Did it involve swelling of the face/tongue/throat, SOB, or low BP? Yes Did it involve sudden or severe rash/hives, skin peeling, or any reaction on the inside of your mouth or nose? No Did you need to seek medical attention at a hospital or doctor's office? No     Prior to Admission medications   Medication Sig Start Date End Date Taking? Authorizing Provider  acetaminophen (TYLENOL) 500 MG tablet Take 1,000  mg by mouth every 6 (six) hours as needed for moderate pain.   Yes [provider]  amLODipine (NORVASC) 5 MG tablet Take 5 mg by mouth at bedtime.   Yes [provider]  Biotin 96789 MCG TABS Take 10,000 mcg by mouth daily.   Yes [provider]  Black Cohosh 540 MG CAPS Take 540 mg by mouth daily.   Yes [provider]  cyclobenzaprine (FLEXERIL) 10 MG tablet Take 10 mg by mouth 3 (three) times daily as needed for muscle spasms.   Yes [provider]  diclofenac (VOLTAREN) 75 MG EC tablet Take 75 mg by mouth 2 (two) times daily.   Yes [provider]  Iodine, Kelp, (KELP PO) Take 225 mcg by mouth daily.   Yes [provider]  losartan (COZAAR) 100 MG tablet Take 50 mg by mouth 2 (two) times daily.   Yes [provider]  methocarbamol (ROBAXIN) 500 MG tablet Take 500 mg by mouth every 8 (eight) hours as needed for muscle spasms.   Yes [provider]  Omega-3 Fatty Acids (FISH OIL) 1000 MG CAPS Take 1,000 mg by mouth daily.   Yes [provider]  oxybutynin (DITROPAN-XL) 5 MG 24 hr tablet Take 5 mg by mouth at bedtime.   Yes [provider]  TURMERIC PO Take 1,000 mg by mouth daily.   Yes [provider]  vitamin B-12 (CYANOCOBALAMIN) 1000 MCG tablet Take 1,000 mcg by mouth daily.   Yes [provider]     All other systems have been reviewed and were otherwise negative with the exception of those mentioned in the HPI and as above.  Physical Exam: There were no vitals filed for this visit.  There is no height or weight on file to calculate BMI.  General: Alert, no acute distress Cardiovascular: No pedal edema Respiratory: No cyanosis, no use of accessory musculature Skin: No lesions in the area of chief complaint Neurologic: Sensation intact distally Psychiatric: Patient is competent for consent with normal mood and affect Lymphatic: No axillary or cervical lymphadenopathy   Assessment/Plan: LUMBAR RADICULOPATHY, SEVERE SPINAL STENOSIS, INSTABILITY, NEUROGENIC CLAUDICATION  Plan for Procedure(s): LEFT LUMBAR 2-LUMBAR 5 DECOMPRESSION WITH LEFT LUMBAR 3-4, LUMBAR 4-5 TRANSFORAMINAL LUMBAR INTERBODY FUSION WITH INSTRUMENTATION AND ALLOGRAFT   Jackelyn Hoehn, MD 01/18/2020 6:52 AM

## 2020-01-18 NOTE — Op Note (Signed)
PATIENT NAME: Tracy Vincent   MEDICAL RECORD NO.:   [id number]    DATE OF BIRTH: [birth year]   DATE OF PROCEDURE: 01/18/2020                               OPERATIVE REPORT     PREOPERATIVE DIAGNOSES: 1. Severe spinal stenosis L2-3, L3-4, L4-5 2. Neurogenic claudication and lumbar radiculopathy 3. L3-4, L4-5 spondylolisthesis   POSTOPERATIVE DIAGNOSES: 1. Severe spinal stenosis L2-3, L3-4, L4-5 2. Neurogenic claudication and lumbar radiculopathy 3. L3-4, L4-5 spondylolisthesis   PROCEDURES: 1. Lumbar decompression, L2-3, L3-4, L4-5, including bilateral partial facetectomy, and bilateral lumbar decompression 2. Left-sided L3-4, L4-5 transforaminal lumbar interbody fusion. 3. Right-sided L3-4, L4-5 posterolateral fusion. 4. Insertion of interbody device x 2 (Concorde intervertebral spacers). 5. Placement of segmental posterior instrumentation L3, L4, L5, bilaterally. 6. Use of local autograft. 7. Use of morselized allograft - ViviGen. 8. Intraoperative use of fluoroscopy.   SURGEON:  Phylliss Bob, MD.   ASSISTANTPricilla Holm, PA-C.   ANESTHESIA:  General endotracheal anesthesia.   COMPLICATIONS:  None.   DISPOSITION:  Stable.   ESTIMATED BLOOD LOSS:  200cc   INDICATIONS FOR SURGERY:  Briefly, Ms. Kim is a pleasant 59 y.o. -year-old Female who did present to me with severe and ongoing pain in the bilateral legs. I did feel that her symptoms were secondary to the findings noted above. The patient failed conservative care and did wish to proceed with the procedure  noted above.    OPERATIVE DETAILS:  On 01/18/2020, the patient was brought to surgery and general endotracheal anesthesia was administered.  The patient was placed prone on a well-padded flat Jackson bed with a spinal frame.  Antibiotics were given and a time-out procedure was performed. The back was prepped and draped in the usual fashion.  A midline incision was made overlying the L2-3, L3-4 and L4-5  intervertebral spaces.  The fascia was incised at the midline.  The paraspinal musculature was bluntly swept laterally.  Anatomic landmarks for the pedicles were exposed. Using fluoroscopy, I did cannulate the L3, L4, and L5 pedicles bilaterally, using a medial to lateral cortical trajectory technique.  On the right side, the posterolateral gutter and facet joints at L3-4 and L4-5 were decorticated and 6 mm screws of the appropriate length were placed at L3, L4, and L5 pedicles and a 65-mm rod was placed and distraction was applied across the rod at each intervertebral level.  On the left side, the cannulated pedicle holes were filled with bone wax.  I then proceeded with the decompressive aspect of the procedure.     Specifically, I did perform a bilateral partial facetectomy, with complete decompression of the right and left lateral recess.  This was first done at L4-5, and then the decompression was carried out to L3-4, and finally, at L2-3.  I was very pleased with the decompression that I was able to accomplish.  The spinal canal was thoroughly decompressed from L2-L5. I then turned my attention to the L4-5 intervertebral space. With an assistant holding medial retraction of the traversing left L5 nerve, I did perform a thorough and complete L4-5 intervertebral discectomy.  The intervertebral space was then liberally packed with autograft from the decompression, as well as allograft in the form of ViviGen, as was the appropriately sized intervertebral spacer (12 mm, parallel).  Distraction was then released on the contralateral right side.  I then turned my  attention to the L3-4 level.  With an assistant holding medial retraction of the traversing left L4 nerve, I did perform an annulotomy at the posterolateral aspect of the L3-4 intervertebral space.  I then used a series of curettes and pituitary rongeurs to perform a thorough and complete intervertebral diskectomy.  The intervertebral space was  then liberally packed with autograft as well as allograft in the form of ViviGen, as was the appropriate-sized intervertebral spacer (10 mm, lordotic).  The spacer was then tamped into position in the usual fashion.  I was very pleased with the press-fit of the spacer.  I then placed 6 mm screws on the left at L3, L4, and L5.  A 65-mm rod was then placed and caps were placed. The distraction was then released on the contralateral right side.  All 6 caps were then locked.    I was very pleased with the decompression and the fusion construct.  The wound was copiously irrigated with a total of approximately 3 L prior to placing the bone graft.  Additional autograft and allograft were then packed into the posterolateral gutter on the right side to help aid in the success of the fusion.  The wound was  explored for any undue bleeding and there was no substantial bleeding encountered.    I did however elect to place a #12 Blake drain, deep to the fascia.  Gel-Foam was placed over the laminectomy site.  The wound was then closed in layers using #1 Vicryl followed by 2-0 Vicryl, followed by 4-0 Monocryl.  Benzoin and Steri-Strips were applied followed by sterile dressing.     Of note, I did use triggered EMG to test the screws on the left, and there was no screw the tested below 20 mA. There was no sustained abnormal EMG activity noted throughout the entire surgery.   Of note, Jason Coop was my assistant throughout surgery, and did aid in retraction, placement of hardware, decompressing, suctioning, and closure.       Estill Bamberg, MD

## 2020-01-18 NOTE — Anesthesia Postprocedure Evaluation (Signed)
Anesthesia Post Note  Patient: Tracy Vincent  Procedure(s) Performed: LEFT LUMBAR 2-LUMBAR 5 DECOMPRESSION WITH LEFT LUMBAR 3-4, LUMBAR 4-5 TRANSFORAMINAL LUMBAR INTERBODY FUSION WITH INSTRUMENTATION AND ALLOGRAFT (Left Back)     Patient location during evaluation: PACU Anesthesia Type: General Level of consciousness: awake and alert Pain management: pain level controlled Vital Signs Assessment: post-procedure vital signs reviewed and stable Respiratory status: spontaneous breathing, nonlabored ventilation and respiratory function stable Cardiovascular status: blood pressure returned to baseline and stable Postop Assessment: no apparent nausea or vomiting Anesthetic complications: no    Last Vitals:  Vitals:   01/18/20 1511 01/18/20 1544  BP: (!) 148/100 (!) 152/98  Pulse: 63 65  Resp: (!) 8 16  Temp: 36.7 C 36.5 C  SpO2: 98% 96%    Last Pain:  Vitals:   01/18/20 1544  TempSrc: Oral  PainSc:                  [name]

## 2020-01-18 NOTE — Transfer of Care (Signed)
Immediate Anesthesia Transfer of Care Note  Patient: Tracy Vincent  Procedure(s) Performed: LEFT LUMBAR 2-LUMBAR 5 DECOMPRESSION WITH LEFT LUMBAR 3-4, LUMBAR 4-5 TRANSFORAMINAL LUMBAR INTERBODY FUSION WITH INSTRUMENTATION AND ALLOGRAFT (Left Back)  Patient Location: PACU  Anesthesia Type:General  Level of Consciousness: drowsy and patient cooperative  Airway & Oxygen Therapy: Patient Spontanous Breathing and Patient connected to face mask oxygen  Post-op Assessment: Report given to RN and Post -op Vital signs reviewed and stable  Post vital signs: Reviewed and stable  Last Vitals:  Vitals Value Taken Time  BP 163/105 01/18/20 1407  Temp    Pulse 74 01/18/20 1408  Resp 20 01/18/20 1408  SpO2 96 % 01/18/20 1408  Vitals shown include unvalidated device data.  Last Pain:  Vitals:   01/18/20 0654  TempSrc: Oral  PainSc: 5       Patients Stated Pain Goal: 3 (01/18/20 0654)  Complications: No apparent anesthesia complications

## 2020-01-18 NOTE — Progress Notes (Signed)
Pharmacy Antibiotic Note  Tracy Vincent is a 59 y.o. female admitted on 01/18/2020 with severe stenosis and instability L2-L5; pt is S/P lumbar decompression surgery this afternoon. Per op note (and confirmed with RN), pt has drain in place post op.  Pharmacy has been consulted for vancomycin dosing for surgical prophylaxis (with drain in place post op).  Pt rec'd vancomycin 1 gm IV X 1 pre op at 0730 AM today. WBC 6.6, afebrile, Scr 0.62, CrCl 86 ml/min.   Plan: Vancomycin 1 gm IV Q 12 hrs (estimated vancomycin AUC on this regimen, using Scr 0.80, is 500.4; goal vancomycin AUC is 400-550) Monitor WBC, temp, signs of post op infection, renal function, vancomycin levels if indicated  Height: 5' 7.5" (171.5 cm) Weight: 179 lb 8 oz (81.4 kg) IBW/kg (Calculated) : 62.75  Temp (24hrs), Avg:98 F (36.7 C), Min:97.7 F (36.5 C), Max:98.1 F (36.7 C)  Recent Labs  Lab 01/16/20 0951  WBC 6.6  CREATININE 0.62    Estimated Creatinine Clearance: 86 mL/min (by C-G formula based on SCr of 0.62 mg/dL).    Allergies  Allergen Reactions  . Other     Other reaction(s): Flushing (ALLERGY/intolerance) Brain 360 Tablets--Caused "Hot/Flush Feeling all over body."  . Penicillins Hives and Swelling    SWELLING OF THE TONGUE-- PEN G Did it involve swelling of the face/tongue/throat, SOB, or low BP? Yes Did it involve sudden or severe rash/hives, skin peeling, or any reaction on the inside of your mouth or nose? No Did you need to seek medical attention at a hospital or doctor's office? No      Microbiology results: 1/19 MRSA PCR: negative 1/19 COVID: negative  Thank you for allowing pharmacy to be a part of this patient's care.  Vicki Mallet, PharmD, BCPS, [facility] Clinical Pharmacist 01/18/2020 3:55 PM

## 2020-01-18 NOTE — Anesthesia Procedure Notes (Signed)
Procedure Name: Intubation Date/Time: 01/18/2020 7:37 AM Performed by: Myna Bright, CRNA Pre-anesthesia Checklist: Patient identified, Emergency Drugs available, Suction available and Patient being monitored Patient Re-evaluated:Patient Re-evaluated prior to induction Oxygen Delivery Method: Circle system utilized Preoxygenation: Pre-oxygenation with 100% oxygen Induction Type: IV induction Ventilation: Mask ventilation without difficulty Laryngoscope Size: Mac and 3 Grade View: Grade I Tube type: Oral Tube size: 7.0 mm Number of attempts: 1 Airway Equipment and Method: Stylet Placement Confirmation: ETT inserted through vocal cords under direct vision,  positive ETCO2 and breath sounds checked- equal and bilateral Secured at: 21 cm Tube secured with: Tape Dental Injury: Teeth and Oropharynx as per pre-operative assessment

## 2020-01-19 ENCOUNTER — Encounter: Payer: Self-pay | Admitting: *Deleted

## 2020-01-19 MED ORDER — OXYCODONE-ACETAMINOPHEN 5-325 MG PO TABS: 1.0000 | ORAL_TABLET | ORAL | 0 refills | Status: AC | PRN

## 2020-01-19 MED FILL — Thrombin (Recombinant) For Soln 20000 Unit: CUTANEOUS | Qty: 1 | Status: AC

## 2020-01-19 NOTE — Progress Notes (Signed)
    Patient doing well Patient denies leg pain Moderate expected back discomfort   Physical Exam: Vitals:   01/18/20 2321 01/19/20 0333  BP: 104/75 107/80  Pulse: 78 75  Resp: 18 18  Temp: 98.8 F (37.1 C) 99 F (37.2 C)  SpO2: 99% 98%    Dressing in place NVI  Drain output: 140/12 hours (and then 50cc/2 hours)  POD #1 s/p L2-5 decompression and L3-5 fusion  - up with PT/OT, encourage ambulation - Percocet for pain, Robaxin for muscle spasms - potential d/c home today with f/u in 2 weeks, but this will depend on drain output - will check in on drain output at noon today

## 2020-01-19 NOTE — Progress Notes (Signed)
Physical Therapy Evaluation  Assessment: Pt admitted with above diagnosis. At the time of PT eval, pt was able to demonstrate transfers and ambulation with up to min assist without AD. With RW, pt able to mobilize at a supervision level. Pt was educated on precautions, brace application/wearing schedule, appropriate activity progression, and car transfer. Pt currently with functional limitations due to the deficits listed below (see PT Problem List). Pt will benefit from skilled PT to increase their independence and safety with mobility to allow discharge to the venue listed below.     01/19/20 1216  PT Visit Information  Last PT Received On 01/19/20  Assistance Needed +1  History of Present Illness Pt is a 59 y/o female who presents s/p L2-L5 decompression, L3-L5 TLIF, and R L3-L5 psoterior lateral fusion on 01/18/2020. PMH significant for HTN.  Precautions  Precautions Fall;Back  Precaution Booklet Issued Yes (comment)  Precaution Comments Reviewed handout (given by OT) and pt was cued for precautions during functional mobility.   Required Braces or Orthoses Spinal Brace  Spinal Brace Lumbar corset;Applied in sitting position  Restrictions  Weight Bearing Restrictions No  Home Living  Family/patient expects to be discharged to: Private residence  Living Arrangements Spouse/significant other  Available Help at Discharge Family  Type of [place] to enter  Entrance Stairs-Number of Steps 4 (Deep - can fit whole walker on each step)  Entrance Stairs-Rails None  Home Layout One level  [place] - single point;Walker - 2 wheels  Prior Function  Level of Independence Independent  Communication  Communication No difficulties  Pain Assessment  Pain Assessment Faces  Faces Pain Scale 4  Pain Location Incision site  Pain Descriptors / Indicators Operative site guarding;Sore  Pain Intervention(s) Limited activity within  patient's tolerance;Monitored during session;Repositioned  Cognition  Arousal/Alertness Awake/alert  Behavior During Therapy WFL for tasks assessed/performed  Overall Cognitive Status Within Functional Limits for tasks assessed  Upper Extremity Assessment  Upper Extremity Assessment Defer to OT evaluation  Lower Extremity Assessment  Lower Extremity Assessment Generalized weakness (Mild - consistent with pre-op diagnosis)  Cervical / Trunk Assessment  Cervical / Trunk Assessment Other exceptions  Cervical / Trunk Exceptions s/p surgery  Bed Mobility  General bed mobility comments Pt sitting up in recliner upon PT arrival.   Transfers  Overall transfer level Needs assistance  Equipment used None;Rolling walker (2 wheeled)  Transfers Sit to/from Stand  Sit to Stand Supervision  General transfer comment Stood without AD; returned to sitting with RW. Pt demonstrated good hand placement on seated surface for safety both times.   Ambulation/Gait  Ambulation/Gait assistance Min assist;Supervision  Gait Distance (Feet) 400 Feet  Assistive device None;Straight cane;Rolling walker (2 wheeled)  Gait Pattern/deviations Step-through pattern;Decreased stride length;Trunk flexed  General Gait Details Initially without AD and pt requiring min assist for balance support. Pt wanting to try and cane, and continued to require HHA on opposite side due to unsteadiness. With RW, pt progressed to supervision level and was able to improve posture and gait speed.   Gait velocity Decreased  Gait velocity interpretation 1.31 - 2.62 ft/sec, indicative of limited community ambulator  Stairs Yes  Stairs assistance Min guard  Stair Management No rails;Backwards;With walker  Number of Stairs 4  General stair comments VC's for sequencing and general safety. Pt was able to negotiate stairs well without assistance (close guard for safety). Pt reports her steps are deep enough to put the  whole walker up on before she  will have to advance to the next step.   Balance  Overall balance assessment Needs assistance  Sitting-balance support Feet supported;No upper extremity supported  Sitting balance-Leahy Scale Fair  Standing balance support No upper extremity supported;During functional activity  Standing balance-Leahy Scale Poor  Standing balance comment Reaching out for external support without AD or therapist assist.   PT - End of Session  Equipment Utilized During Treatment Gait belt  Activity Tolerance Patient tolerated treatment well  Patient left in chair;with call bell/phone within reach  Nurse Communication Mobility status  PT Assessment  PT Recommendation/Assessment Patient needs continued PT services  PT Visit Diagnosis Unsteadiness on feet (R26.81);Pain  Pain - part of body  (back)  PT Problem List Decreased strength;Decreased activity tolerance;Decreased balance;Decreased mobility;Decreased knowledge of use of DME;Decreased safety awareness;Decreased knowledge of precautions;Pain  PT Plan  PT Frequency (ACUTE ONLY) Min 5X/week  PT Treatment/Interventions (ACUTE ONLY) DME instruction;Gait training;Stair training;Functional mobility training;Therapeutic activities;Therapeutic exercise;Neuromuscular re-education;Patient/family education  AM-PAC PT "6 Clicks" Mobility Outcome Measure (Version 2)  Help needed turning from your back to your side while in a flat bed without using bedrails? 4  Help needed moving from lying on your back to sitting on the side of a flat bed without using bedrails? 3  Help needed moving to and from a bed to a chair (including a wheelchair)? 3  Help needed standing up from a chair using your arms (e.g., wheelchair or bedside chair)? 3  Help needed to walk in hospital room? 3  Help needed climbing 3-5 steps with a railing?  3  6 Click Score 19  Consider Recommendation of Discharge To: Home with [facility]  PT Recommendation  Follow Up Recommendations No PT follow  up;Supervision for mobility/OOB  PT equipment None recommended by PT  Individuals Consulted  Consulted and Agree with Results and Recommendations Patient  Acute Rehab PT Goals  Patient Stated Goal Home today  PT Goal Formulation With patient  Time For Goal Achievement 01/26/20  Potential to Achieve Goals Good  PT Time Calculation  PT Start Time (ACUTE ONLY) 6283  PT Stop Time (ACUTE ONLY) 0959  PT Time Calculation (min) (ACUTE ONLY) 32 min  PT General Charges  $$ ACUTE PT VISIT 1 Visit  PT Evaluation  $PT Eval Moderate Complexity 1 Mod  PT Treatments  $Gait Training 8-22 mins   Conni Slipper, PT, DPT Acute Rehabilitation Services Pager: [phone] Office: [phone]

## 2020-01-19 NOTE — Evaluation (Signed)
Occupational Therapy Evaluation Patient Details Name: Tracy Vincent MRN: [id number] DOB: 04-24-1961 Today's Date: 01/19/2020    History of Present Illness Pt is a 59 y/o female who presents s/p L2-L5 decompression, L3-L5 TLIF, and R L3-L5 psoterior lateral fusion on 01/18/2020. PMH significant for HTN.   Clinical Impression   Patient evaluated by Occupational Therapy with no further acute OT needs identified. All education has been completed and the patient has no further questions. See below for any follow-up Occupational Therapy or equipment needs. OT to sign off. Thank you for referral.      Follow Up Recommendations  No OT follow up    Equipment Recommendations  None recommended by OT    Recommendations for Other Services       Precautions / Restrictions Precautions Precautions: Fall;Back Precaution Booklet Issued: Yes (comment) Precaution Comments: Reviewed handout for adls Required Braces or Orthoses: Spinal Brace Spinal Brace: Lumbar corset;Applied in sitting position Restrictions Weight Bearing Restrictions: No      Mobility Bed Mobility Overal bed mobility: Modified Independent             General bed mobility comments: increased tim hob 30 degrees  Transfers Overall transfer level: Needs assistance Equipment used: None;Rolling walker (2 wheeled) Transfers: Sit to/from Stand Sit to Stand: Supervision         General transfer comment: pt states "i might have to do my sit to stand thing a few times before i can stand" pt able to stand on first attempt    Balance Overall balance assessment: Needs assistance         Standing balance support: Single extremity supported;No upper extremity supported Standing balance-Leahy Scale: Fair                 High Level Balance Comments: LOB with static standing in open space, LOB wtih head turns           ADL either performed or assessed with clinical judgement   ADL Overall ADL's : Needs  assistance/impaired Eating/Feeding: Independent   Grooming: Wash/dry hands;Wash/dry face   Upper Body Bathing: Set up   Lower Body Bathing: Min guard   Upper Body Dressing : Set up   Lower Body Dressing: Min guard   Toilet Transfer: Min guard   Toileting- Clothing Manipulation and Hygiene: Min guard       Functional mobility during ADLs: Minimal assistance General ADL Comments: Pt with LOB x3 during session   Back handout provided and reviewed adls in detail. Pt educated on: clothing between brace, never sleep in brace, set an alarm at night for medication, avoid sitting for long periods of time, correct bed positioning for sleeping, correct sequence for bed mobility, avoiding lifting more than 5 pounds and never wash directly over incision. All education is complete and patient indicates understanding.    Vision Patient Visual Report: No change from baseline       Perception     Praxis      Pertinent Vitals/Pain Pain Assessment: Faces Faces Pain Scale: Hurts little more Pain Location: Incision site Pain Descriptors / Indicators: Operative site guarding;Sore Pain Intervention(s): Monitored during session;Premedicated before session;Repositioned     Hand Dominance Right   Extremity/Trunk Assessment Upper Extremity Assessment Upper Extremity Assessment: Overall WFL for tasks assessed   Lower Extremity Assessment Lower Extremity Assessment: Defer to PT evaluation   Cervical / Trunk Assessment Cervical / Trunk Assessment: Other exceptions Cervical / Trunk Exceptions: s/p surgery   Communication Communication Communication: No difficulties  Cognition Arousal/Alertness: Awake/alert Behavior During Therapy: WFL for tasks assessed/performed Overall Cognitive Status: Within Functional Limits for tasks assessed                                     General Comments  able to done off brace    Exercises     Shoulder Instructions      Home  Living Family/patient expects to be discharged to:: Private residence Living Arrangements: Spouse/significant other Available Help at Discharge: Family Type of Home: House Home Access: Stairs to enter [employer] of Steps: 4(Deep - can fit whole walker on each step) Entrance Stairs-Rails: None Home Layout: One level         Bathroom Toilet: Handicapped height     Home Equipment: [place] - single point;Walker - 2 wheels          Prior Functioning/Environment Level of Independence: Independent                 OT Problem List: Decreased strength;Impaired balance (sitting and/or standing);Decreased activity tolerance      OT Treatment/Interventions:      OT Goals(Current goals can be found in the care plan section) Acute Rehab OT Goals Patient Stated Goal: to return home but wtih medication before i go Potential to Achieve Goals: Good  OT Frequency:     Barriers to D/C:            Co-evaluation              AM-PAC OT "6 Clicks" Daily Activity     Outcome Measure Help from another person eating meals?: None Help from another person taking care of personal grooming?: None Help from another person toileting, which includes using toliet, bedpan, or urinal?: A Little Help from another person bathing (including washing, rinsing, drying)?: A Little Help from another person to put on and taking off regular upper body clothing?: None Help from another person to put on and taking off regular lower body clothing?: A Little 6 Click Score: 21   End of Session Equipment Utilized During Treatment: Gait belt;Back brace Nurse Communication: Mobility status;Precautions  Activity Tolerance: Patient tolerated treatment well Patient left: in chair;with call bell/phone within reach  OT Visit Diagnosis: Unsteadiness on feet (R26.81)                Time: 0923-3007 OT Time Calculation (min): 35 min Charges:  OT General Charges $OT Visit: 1 Visit OT Evaluation $OT  Eval Moderate Complexity: 1 Mod OT Treatments $Self Care/Home Management : 8-22 mins   Brynn, OTR/L  Acute Rehabilitation Services Pager: [phone] Office: [phone] .   Jeri Modena 01/19/2020, 12:44 PM

## 2020-01-19 NOTE — Progress Notes (Signed)
Patient is discharged from room 3C06 at this time. Alert and in stable condition. IV site d/c'd and instructions read to patient with understanding verbalized. Left unit via wheelchair with all belongings at side.  

## 2020-01-23 MED FILL — Heparin Sodium (Porcine) Inj 1000 Unit/ML: INTRAMUSCULAR | Qty: 30 | Status: AC

## 2020-01-23 MED FILL — Sodium Chloride Irrigation Soln 0.9%: Qty: 3000 | Status: AC

## 2020-02-01 NOTE — Discharge Summary (Signed)
Patient ID: Tracy Vincent MRN: [id number] DOB/AGE: 58-22-1962 58 y.o.  Admit date: 01/18/2020 Discharge date: 01/19/2020  Admission Diagnoses:  Active Problems:   Spinal stenosis   Discharge Diagnoses:  Same  Past Medical History:  Diagnosis Date  . Family history of adverse reaction to anesthesia   . Hypertension   . Pneumonia    hx of pneumonia per pt   . PONV (postoperative nausea and vomiting)     Surgeries: Procedure(s): LEFT LUMBAR 2-LUMBAR 5 DECOMPRESSION WITH LEFT LUMBAR 3-4, LUMBAR 4-5 TRANSFORAMINAL LUMBAR INTERBODY FUSION WITH INSTRUMENTATION AND ALLOGRAFT on 01/18/2020   Consultants: None  Discharged Condition: Improved  Hospital Course: Tracy Vincent is an 59 y.o. female who was admitted 01/18/2020 for operative treatment of radiculopathy. Patient has severe unremitting pain that affects sleep, daily activities, and work/hobbies. After pre-op clearance the patient was taken to the operating room on 01/18/2020 and underwent  Procedure(s): LEFT LUMBAR 2-LUMBAR 5 DECOMPRESSION WITH LEFT LUMBAR 3-4, LUMBAR 4-5 TRANSFORAMINAL LUMBAR INTERBODY FUSION WITH INSTRUMENTATION AND ALLOGRAFT.    Patient was given perioperative antibiotics:  Anti-infectives (From admission, onward)   Start     Dose/Rate Route Frequency Ordered Stop   01/18/20 1830  vancomycin (VANCOCIN) IVPB 1000 mg/200 mL premix  Status:  Discontinued     1,000 mg 200 mL/hr over 60 Minutes Intravenous Every 12 hours 01/18/20 1605 01/19/20 2117   01/18/20 0715  vancomycin (VANCOCIN) IVPB 1000 mg/200 mL premix     1,000 mg 200 mL/hr over 60 Minutes Intravenous  Once 01/18/20 0706 01/18/20 0830   01/18/20 0707  vancomycin (VANCOCIN) 1-5 GM/200ML-% IVPB    Note to Pharmacy: Granville Lewis, Lindsi   : cabinet override      01/18/20 0707 01/18/20 0810       Patient was given sequential compression devices, early ambulation to prevent DVT.  Patient benefited maximally from hospital stay and there were no complications.     Recent vital signs: BP 129/84 (BP Location: Right Arm)   Pulse 80   Temp 98.2 F (36.8 C) (Oral)   Resp 18   Ht 5' 7.5" (1.715 m)   Wt 81.4 kg   SpO2 100%   BMI 27.70 kg/m   Discharge Medications:   Allergies as of 01/19/2020      Reactions   Other    Other reaction(s): Flushing (ALLERGY/intolerance) Brain 360 Tablets--Caused "Hot/Flush Feeling all over body."   Penicillins Hives, Swelling   SWELLING OF THE TONGUE-- PEN G Did it involve swelling of the face/tongue/throat, SOB, or low BP? Yes Did it involve sudden or severe rash/hives, skin peeling, or any reaction on the inside of your mouth or nose? No Did you need to seek medical attention at a hospital or doctor's office? No      Medication List    TAKE these medications   acetaminophen 500 MG tablet Commonly known as: TYLENOL Take 1,000 mg by mouth every 6 (six) hours as needed for moderate pain.   amLODipine 5 MG tablet Commonly known as: NORVASC Take 5 mg by mouth at bedtime.   Biotin 10000 MCG Tabs Take 10,000 mcg by mouth daily.   Black Cohosh 540 MG Caps Take 540 mg by mouth daily.   cyclobenzaprine 10 MG tablet Commonly known as: FLEXERIL Take 10 mg by mouth 3 (three) times daily as needed for muscle spasms.   diclofenac 75 MG EC tablet Commonly known as: VOLTAREN Take 75 mg by mouth 2 (two) times daily.   Fish Oil 1000  MG Caps Take 1,000 mg by mouth daily.   KELP PO Take 225 mcg by mouth daily.   losartan 100 MG tablet Commonly known as: COZAAR Take 50 mg by mouth 2 (two) times daily.   methocarbamol 500 MG tablet Commonly known as: ROBAXIN Take 500 mg by mouth every 8 (eight) hours as needed for muscle spasms.   oxybutynin 5 MG 24 hr tablet Commonly known as: DITROPAN-XL Take 5 mg by mouth at bedtime.   oxyCODONE-acetaminophen 5-325 MG tablet Commonly known as: PERCOCET/ROXICET Take 1-2 tablets by mouth every 4 (four) hours as needed for moderate pain or severe pain.   TURMERIC  PO Take 1,000 mg by mouth daily.   vitamin B-12 1000 MCG tablet Commonly known as: CYANOCOBALAMIN Take 1,000 mcg by mouth daily.       Diagnostic Studies: DG Lumbar Spine 2-3 Views  Result Date: 01/18/2020 CLINICAL DATA:  Lumbar fusion. EXAM: DG C-ARM 1-60 MIN; LUMBAR SPINE - 2-3 VIEW FLUOROSCOPY TIME:  Fluoroscopy Time:  1 minutes 4 seconds Number of Acquired Spot Images: 2 COMPARISON:  Prior study same day. FINDINGS: Lower lumbar posterior interbody fusion. Hardware intact. Stable alignment. IMPRESSION: Postsurgical changes lumbar spine. Electronically Signed   By: Maisie Fus  Register   On: 01/18/2020 13:47   DG Lumbar Spine 1 View  Result Date: 01/18/2020 CLINICAL DATA:  Operative portable lateral view of the lumbar spine for surgical localization. EXAM: LUMBAR SPINE - 1 VIEW COMPARISON:  None. FINDINGS: Operative views demonstrate placement of surgical needles, the more superior projecting between the posterior tips of the L1 and L2 spinous processes, 6.5 cm posterior to the posterior margin of the mid L2 vertebra. The more inferior projects over the posterior margin of the L4 spinous process, 5.8 cm posterior to the posterior margin of the L4-L5 disc interspace. IMPRESSION: Portable lateral lumbar spine image for surgical localization. Electronically Signed   By: Amie Portland M.D.   On: 01/18/2020 13:46   DG C-Arm 1-60 Min  Result Date: 01/18/2020 CLINICAL DATA:  Lumbar fusion. EXAM: DG C-ARM 1-60 MIN; LUMBAR SPINE - 2-3 VIEW FLUOROSCOPY TIME:  Fluoroscopy Time:  1 minutes 4 seconds Number of Acquired Spot Images: 2 COMPARISON:  Prior study same day. FINDINGS: Lower lumbar posterior interbody fusion. Hardware intact. Stable alignment. IMPRESSION: Postsurgical changes lumbar spine. Electronically Signed   By: Maisie Fus  Register   On: 01/18/2020 13:47    Disposition: Discharge disposition: 01-Home or Self Care     POD #1 s/p L2-5 decompression and L3-5 fusion  - up with PT/OT, encourage  ambulation - Percocet for pain, Robaxin for muscle spasms -  d/c home today with f/u in 2 weeks, but this will depend on drain output - will check in on drain output at noon today  Signed: Eilene Ghazi Leeyah Heather 02/01/2020, 11:20 AM

## 2021-08-01 IMAGING — RF DG LUMBAR SPINE 2-3V
1 series · 2 of 2 positions shown · non-contrast
Comparison: Prior study same day.

CLINICAL DATA: Lumbar fusion.

EXAM:
DG C-ARM 1-60 MIN; LUMBAR SPINE - 2-3 VIEW
FLUOROSCOPY TIME:  Fluoroscopy Time:  1 minutes 4 seconds
Number of Acquired Spot Images: 2

[Series 1: run · 2 of 2 slices shown]
[im 1/2]
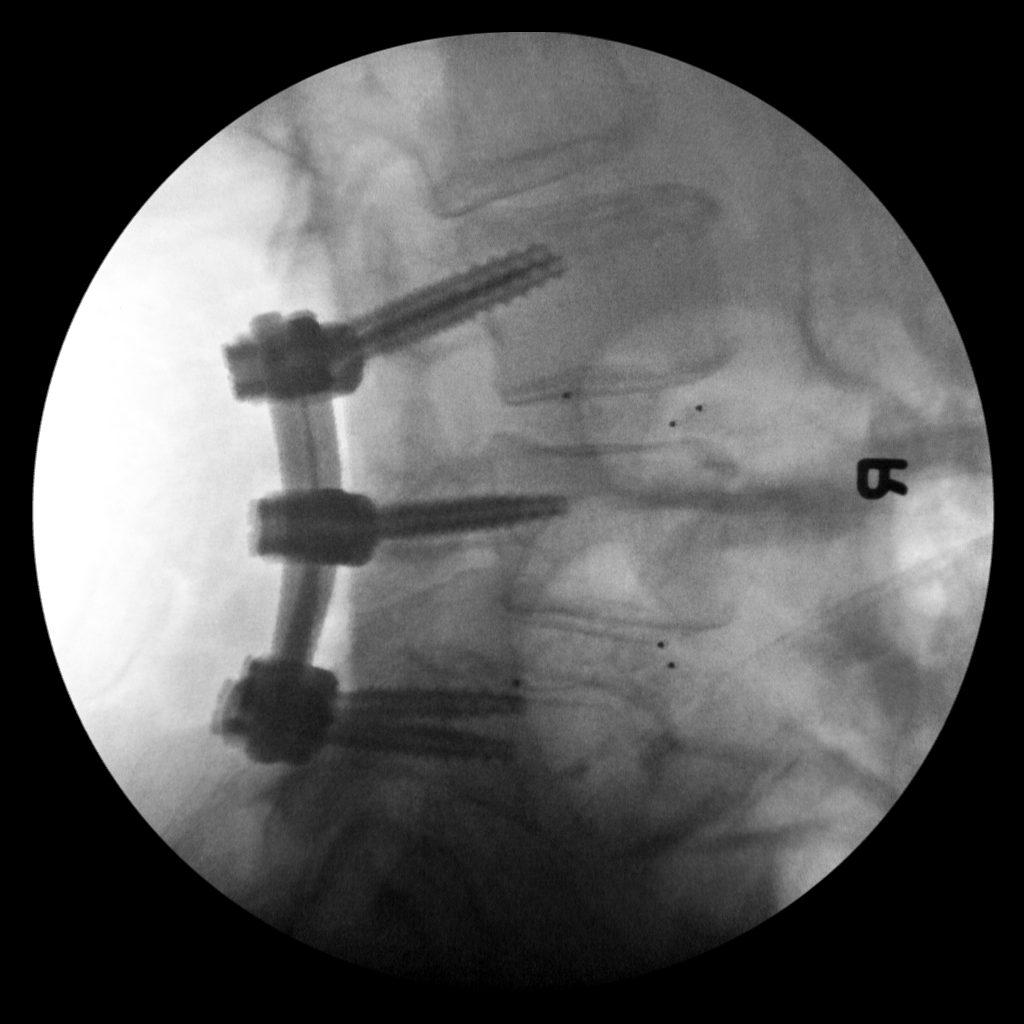
[im 2/2]
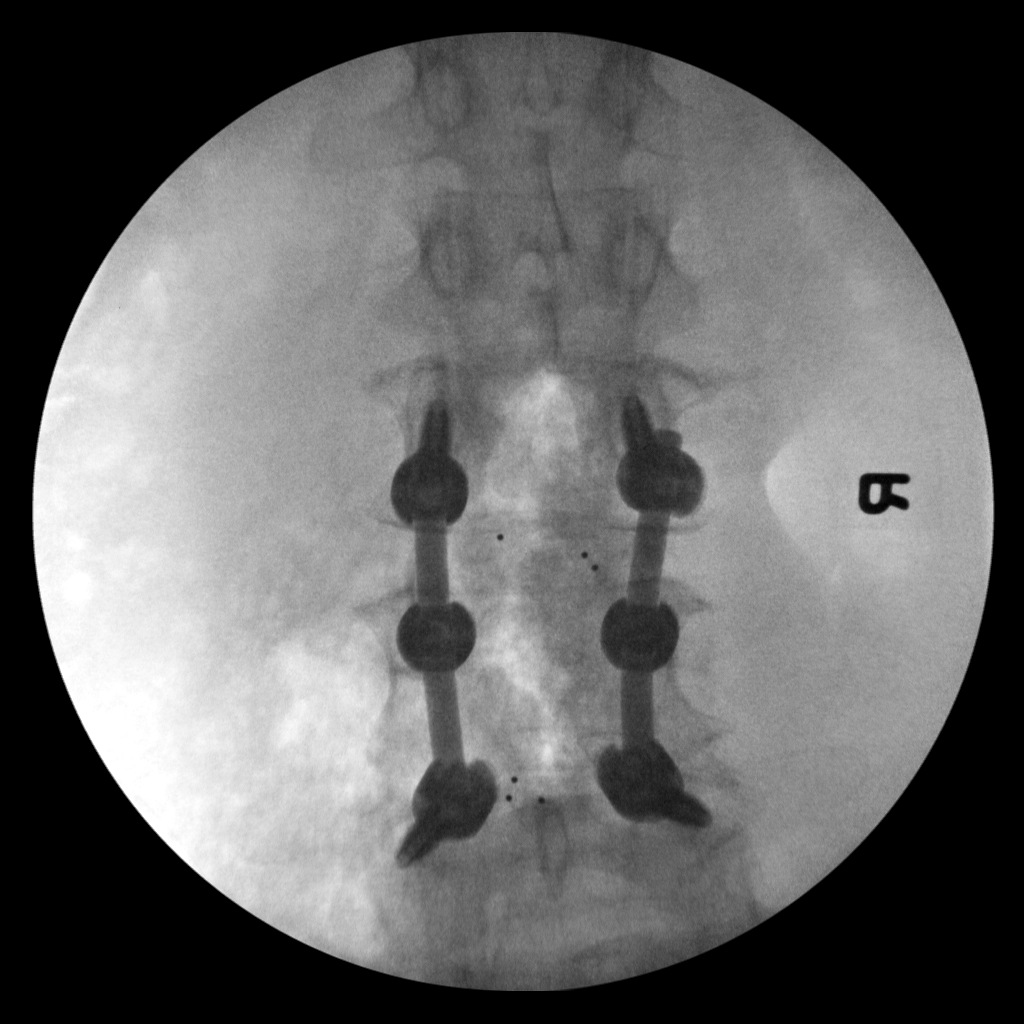

[2 of 2 positions shown; findings below may reference images not displayed]

FINDINGS: Lower lumbar posterior interbody fusion. Hardware intact. Stable
alignment.
IMPRESSION: Postsurgical changes lumbar spine.
